# Patient Record
Sex: Male | Born: 1985 | Race: White | Hispanic: No | Marital: Married | Smoking: Never smoker
Health system: Southern US, Community
[De-identification: ages and names within clinical notes are randomized; demographics above are authoritative.]

## PROBLEM LIST (undated history)

## (undated) DIAGNOSIS — F32A Depression, unspecified: Secondary | ICD-10-CM

## (undated) DIAGNOSIS — F329 Major depressive disorder, single episode, unspecified: Secondary | ICD-10-CM

## (undated) DIAGNOSIS — F419 Anxiety disorder, unspecified: Secondary | ICD-10-CM

---

## 2015-12-28 DIAGNOSIS — F322 Major depressive disorder, single episode, severe without psychotic features: Secondary | ICD-10-CM | POA: Diagnosis present

## 2018-06-18 DIAGNOSIS — F411 Generalized anxiety disorder: Secondary | ICD-10-CM | POA: Diagnosis present

## 2018-07-15 ENCOUNTER — Encounter: Payer: Self-pay | Admitting: Emergency Medicine

## 2018-07-15 ENCOUNTER — Emergency Department: Payer: No Typology Code available for payment source

## 2018-07-15 ENCOUNTER — Other Ambulatory Visit: Payer: Self-pay

## 2018-07-15 ENCOUNTER — Emergency Department
Admission: EM | Admit: 2018-07-15 | Discharge: 2018-07-15 | Disposition: A | Discharge status: Home / Self Care | Payer: No Typology Code available for payment source | Attending: Emergency Medicine | Admitting: Emergency Medicine

## 2018-07-15 DIAGNOSIS — N132 Hydronephrosis with renal and ureteral calculous obstruction: Secondary | ICD-10-CM | POA: Diagnosis not present

## 2018-07-15 DIAGNOSIS — N2889 Other specified disorders of kidney and ureter: Secondary | ICD-10-CM | POA: Diagnosis not present

## 2018-07-15 DIAGNOSIS — R109 Unspecified abdominal pain: Secondary | ICD-10-CM | POA: Diagnosis present

## 2018-07-15 DIAGNOSIS — F419 Anxiety disorder, unspecified: Secondary | ICD-10-CM | POA: Insufficient documentation

## 2018-07-15 DIAGNOSIS — F329 Major depressive disorder, single episode, unspecified: Secondary | ICD-10-CM | POA: Diagnosis not present

## 2018-07-15 HISTORY — DX: Depression, unspecified: F32.A

## 2018-07-15 HISTORY — DX: Major depressive disorder, single episode, unspecified: F32.9

## 2018-07-15 HISTORY — DX: Anxiety disorder, unspecified: F41.9

## 2018-07-15 LAB — URINALYSIS, ROUTINE W REFLEX MICROSCOPIC
BILIRUBIN URINE: NEGATIVE
Glucose, UA: NEGATIVE mg/dL
Ketones, ur: 5 mg/dL — AB
LEUKOCYTE UA: NEGATIVE
Nitrite: NEGATIVE
Protein, ur: 30 mg/dL — AB
RBC / HPF: >50 RBC/hpf — ABNORMAL HIGH (ref 0–5)
Specific Gravity, Urine: 1.019 (ref 1.005–1.030)
Squamous Epithelial / HPF: NONE SEEN (ref 0–5)
pH: 7 (ref 5.0–8.0)

## 2018-07-15 LAB — CBC WITH DIFFERENTIAL/PLATELET
Abs Immature Granulocytes: 0.03 10*3/uL (ref 0.00–0.07)
Basophils Absolute: 0.1 10*3/uL (ref 0.0–0.1)
Basophils Relative: 1 %
Eosinophils Absolute: 0.3 10*3/uL (ref 0.0–0.5)
Eosinophils Relative: 3 %
HCT: 44.8 % (ref 39.0–52.0)
Hemoglobin: 15.6 g/dL (ref 13.0–17.0)
Immature Granulocytes: 0 %
Lymphocytes Relative: 16 %
Lymphs Abs: 1.4 10*3/uL (ref 0.7–4.0)
MCH: 31.6 pg (ref 26.0–34.0)
MCHC: 34.8 g/dL (ref 30.0–36.0)
MCV: 90.9 fL (ref 80.0–100.0)
Monocytes Absolute: 0.6 10*3/uL (ref 0.1–1.0)
Monocytes Relative: 7 %
Neutro Abs: 6.3 10*3/uL (ref 1.7–7.7)
Neutrophils Relative %: 73 %
Platelets: 212 10*3/uL (ref 150–400)
RBC: 4.93 MIL/uL (ref 4.22–5.81)
RDW: 11.9 % (ref 11.5–15.5)
WBC: 8.6 10*3/uL (ref 4.0–10.5)
nRBC: 0 % (ref 0.0–0.2)

## 2018-07-15 LAB — COMPREHENSIVE METABOLIC PANEL
ALT: 32 U/L (ref 0–44)
AST: 36 U/L (ref 15–41)
Albumin: 4.1 g/dL (ref 3.5–5.0)
Alkaline Phosphatase: 50 U/L (ref 38–126)
Anion gap: 9 (ref 5–15)
BUN: 10 mg/dL (ref 6–20)
CHLORIDE: 107 mmol/L (ref 98–111)
CO2: 24 mmol/L (ref 22–32)
Calcium: 9.2 mg/dL (ref 8.9–10.3)
Creatinine, Ser: 0.99 mg/dL (ref 0.61–1.24)
GFR calc Af Amer: >60 mL/min (ref >60)
Glucose, Bld: 125 mg/dL — ABNORMAL HIGH (ref 70–99)
Potassium: 4 mmol/L (ref 3.5–5.1)
Sodium: 140 mmol/L (ref 135–145)
Total Bilirubin: 0.7 mg/dL (ref 0.3–1.2)
Total Protein: 6.7 g/dL (ref 6.5–8.1)

## 2018-07-15 LAB — LIPASE, BLOOD: Lipase: 30 U/L (ref 11–51)

## 2018-07-15 MED ORDER — OXYCODONE-ACETAMINOPHEN 5-325 MG PO TABS: 2.0000 | ORAL_TABLET | Freq: Four times a day (QID) | ORAL | 0 refills | Status: DC | PRN

## 2018-07-15 MED ORDER — TAMSULOSIN HCL 0.4 MG PO CAPS: ORAL_CAPSULE | ORAL | 0 refills | Status: AC

## 2018-07-15 MED ORDER — ONDANSETRON HCL 4 MG/2ML IJ SOLN
4.0000 mg | INTRAMUSCULAR | Status: AC
  Administered 2018-07-15: 4 mg via INTRAVENOUS
  Filled 2018-07-15: qty 2

## 2018-07-15 MED ORDER — ONDANSETRON 4 MG PO TBDP: ORAL_TABLET | ORAL | 0 refills | Status: AC

## 2018-07-15 MED ORDER — DOCUSATE SODIUM 100 MG PO CAPS: ORAL_CAPSULE | ORAL | 0 refills | Status: AC

## 2018-07-15 MED ORDER — KETOROLAC TROMETHAMINE 30 MG/ML IJ SOLN
15.0000 mg | Freq: Once | INTRAMUSCULAR | Status: AC
  Administered 2018-07-15: 15 mg via INTRAVENOUS
  Filled 2018-07-15: qty 1

## 2018-07-15 MED ORDER — HYDROMORPHONE HCL 1 MG/ML IJ SOLN
1.0000 mg | INTRAMUSCULAR | Status: AC
  Administered 2018-07-15: 1 mg via INTRAVENOUS
  Filled 2018-07-15: qty 1

## 2018-07-15 NOTE — ED Triage Notes (Signed)
Pt to triage via w/c, moaning & grimacing; c/o right sided back & abd pain x wk; denies hx of same

## 2018-07-15 NOTE — ED Notes (Signed)
Pt is AOx4, vss, he does not show any signs of distress and c/o pain at 5/10 in his groin. MD aware. Pt is in bed with rails upx2, on the monitor and call bell is within reach. We will continue to monitor the pt.

## 2018-07-15 NOTE — ED Provider Notes (Signed)
[facility] Emergency Department Provider Note  ____________________________________________   First MD Initiated Contact with Patient 07/15/18 [phone]     (approximate)  I have reviewed the triage vital signs and the nursing notes.   HISTORY  Chief Complaint Flank Pain   Level 5 caveat:  history/ROS limited by acute/critical illness   HPI Gilbert Cabrera is a 33 y.o. male with psychiatric history as listed below and who reports no chronic medical issues and no prior surgeries.  He presents per private vehicle for evaluation of severe right flank pain that is radiating into the right side of his abdomen and down into his groin.  He reports that he has had the pain intermittently for about a week but it has been relatively mild.  Is gotten much worse over the last day and then tonight it became severe and intolerable.  He is crying and yelling and moaning in pain and has difficulty focusing on my questions.   He denies fever/chills, chest pain, shortness of breath.  He has had some nausea, no vomiting.  No diarrhea nor constipation.  No dysuria and no hematuria but he says he feels like he needs to urinate but he is not able to do so.  The testicles are not specifically tender and he has no swelling in his scrotum, but the pain seems to radiate from his right flank down into the groin.  He has no history of kidney stones, no prior surgical history.  He says that his depression has been worse than usual lately but he has been taking his depression medications.        Past Medical History:  Diagnosis Date   Anxiety    Depression     There are no active problems to display for this patient.   History reviewed. No pertinent surgical history.  Prior to Admission medications   Medication Sig Start Date End Date Taking? Authorizing Provider  docusate sodium (COLACE) 100 MG capsule Take 1 tablet once or twice daily as needed for constipation while taking  narcotic pain medicine 07/15/18   Loleta RoseForbach, Deklen Popelka, MD  ondansetron (ZOFRAN ODT) 4 MG disintegrating tablet Allow 1-2 tablets to dissolve in your mouth every 8 hours as needed for nausea/vomiting 07/15/18   Loleta RoseForbach, Britnie Colville, MD  oxyCODONE-acetaminophen (PERCOCET) 5-325 MG tablet Take 2 tablets by mouth every 6 (six) hours as needed for severe pain. 07/15/18   Loleta RoseForbach, Veeda Virgo, MD  tamsulosin (FLOMAX) 0.4 MG CAPS capsule Take 1 tablet by mouth daily until you pass the kidney stone or no longer have symptoms 07/15/18   Loleta RoseForbach, Max Nuno, MD    Allergies Patient has no known allergies.  No family history on file.  Social History Social History   Tobacco Use   Smoking status: Not on file  Substance Use Topics   Alcohol use: Not on file   Drug use: Not on file    Review of Systems Constitutional: No fever/chills Eyes: No visual changes. ENT: No sore throat. Cardiovascular: Denies chest pain. Respiratory: Denies shortness of breath. Gastrointestinal: Right flank pain that radiates into the right lower quadrant of the abdomen and right groin.  Nausea, no vomiting, no diarrhea nor constipation. Genitourinary: Pain radiating from the right flank into the groin. Negative for dysuria and hematuria. Musculoskeletal: Right flank pain that radiates into the right lower quadrant of the abdomen and groin as described above. Integumentary: Negative for rash. Neurological: Negative for headaches, focal weakness or numbness.   ____________________________________________   PHYSICAL  EXAM:  VITAL SIGNS: ED Triage Vitals  Enc Vitals Group     BP 07/15/18 0627 (!) 146/130     Pulse Rate 07/15/18 0627 83     Resp 07/15/18 0627 18     Temp 07/15/18 0627 97.9 F (36.6 C)     Temp Source 07/15/18 0627 Oral     SpO2 07/15/18 0627 100 %     Weight 07/15/18 0620 77.1 kg (170 lb)     Height 07/15/18 0620 1.753 m ( )     Head Circumference --      Peak Flow --      Pain Score 07/15/18 0621 10     Pain  Loc --      Pain Edu? --      Excl. in GC? --     Constitutional: Alert and oriented.  The patient is generally healthy in appearance but is in severe distress at the moment. Eyes: Conjunctivae are normal.  Head: Atraumatic. Nose: No congestion/rhinnorhea. Mouth/Throat: Mucous membranes are moist. Neck: No stridor.  No meningeal signs.   Cardiovascular: Normal rate, regular rhythm. Good peripheral circulation. Grossly normal heart sounds. Respiratory: Normal respiratory effort.  No retractions. Lungs CTAB. Gastrointestinal: Soft and nondistended.  Generalized diffuse tenderness to palpation of the abdomen but exam is limited by the patient's acute pain/discomfort.  Patient is guarding and cannot relax for a good abdominal exam. Genitourinary: Normal external male genitalia.  No tenderness to palpation of the testes, no scrotal edema, no penile discharge. Musculoskeletal: Severe tenderness to percussion of the right flank.  No lower extremity tenderness nor edema. No gross deformities of extremities. Neurologic:  Normal speech and language. No gross focal neurologic deficits are appreciated.  Skin:  Skin is warm, dry and intact. No rash noted. Psychiatric: Mood and affect are very anxious and in severe distress.  Patient denies SI/HI but admits to worse recent depression.  ____________________________________________   LABS (all labs ordered are listed, but only abnormal results are displayed)  Labs Reviewed  COMPREHENSIVE METABOLIC PANEL - Abnormal; Notable for the following components:      Result Value   Glucose, Bld 125 (*)    All other components within normal limits  CBC WITH DIFFERENTIAL/PLATELET  LIPASE, BLOOD  URINALYSIS, ROUTINE W REFLEX MICROSCOPIC   ____________________________________________  EKG  None - EKG not ordered by ED physician ____________________________________________  RADIOLOGY   ED MD interpretation:  small right UVJ stone with hydronephrosis.   left kidney mass  Official radiology report(s): Ct Renal Stone Study  Result Date: 07/15/2018 CLINICAL DATA:  Right-sided abdominal/flank pain EXAM: CT ABDOMEN AND PELVIS WITHOUT CONTRAST TECHNIQUE: Multidetector CT imaging of the abdomen and pelvis was performed following the standard protocol without oral or IV contrast. COMPARISON:  None. FINDINGS: Lower chest: Lung bases are clear. Hepatobiliary: No focal liver lesions are appreciable on this noncontrast enhanced study. The gallbladder wall is not appreciably thickened. There is no biliary duct dilatation. Pancreas: No pancreatic mass or inflammatory focus. Spleen: No splenic lesions are evident. Adrenals/Urinary Tract: Adrenals bilaterally appear unremarkable. There is a focal area of increased attenuation in the lateral left mid kidney measuring 1.0 x 1.0 cm. Right kidney appears mildly edematous. There is moderate hydronephrosis on the right. There is no hydronephrosis on the left. There is no appreciable intrarenal calculus on either side. There is a 1 mm calculus at the right ureterovesical junction. No other ureteral calculi are evident. There is a phlebolith on the left near but separate from  the distal left ureter. Urinary bladder is midline with wall thickness within normal limits. Stomach/Bowel: There is no appreciable bowel wall or mesenteric thickening. There is no evident bowel obstruction. There is no free air or portal venous air. Vascular/Lymphatic: No abdominal aortic aneurysm. No vascular lesions are evident on this noncontrast enhanced study. There is no appreciable adenopathy in the abdomen or pelvis. Reproductive: Prostate and seminal vesicles are normal in size and contour. There is no evident pelvic mass. Other: The appendix appears normal. There is no abscess or ascites in the abdomen or pelvis. Musculoskeletal: No blastic or lytic bone lesions. No intramuscular or abdominal wall lesion. IMPRESSION: 1. 1 mm calculus at the right  ureterovesical junction causing moderate hydronephrosis on the right. Right kidney is subtly edematous. 2. 1 x 1 cm mass in the lateral mid left kidney with attenuation values higher than is expected with a cyst. This structure may represent a hyperdense cyst. It does warrant further evaluation, however. Further evaluation with pre and post contrast MRI nonemergently should be considered. Pre and post contrast CT could alternatively be performed, but would likely be of decreased accuracy given lesion size. 3. No bowel obstruction. No abscess in the abdomen or pelvis. Appendix appears normal. Electronically Signed   By: Bretta Bang III M.D.   On: 07/15/2018 07:12    ____________________________________________   PROCEDURES   Procedure(s) performed (including Critical Care):  Procedures   ____________________________________________   INITIAL IMPRESSION / MDM / ASSESSMENT AND PLAN / ED COURSE  As part of my medical decision making, I reviewed the following data within the electronic MEDICAL RECORD NUMBER Nursing notes reviewed and incorporated, Labs reviewed , Old chart reviewed, Patient signed out to Dr. Scotty Court, and Notes from prior ED visits and reviewed [place] Controlled Substance Database.         Differential diagnosis includes, but is not limited to, renal/ureteral colic, UTI/pyelonephritis, appendicitis, biliary colic.  Mesenteric ischemia is much less likely.  The patient reports no travel history and no contact with individuals known to have COVID-19.  He has no respiratory symptoms at all, is afebrile, and has stable vital signs.  The way he is acting with his severe distress, severe pain, inability to remain still, etc., are all strongly consistent with renal colic.  We are placing an IV immediately and giving him hydromorphone 1 mg IV and ondansetron 4 mg IV.  No indication for fluids at this time.  I am checking basic labs and will obtain a CT renal stone protocol of the abdomen and  pelvis for further evaluation of his pain.  He states that he understands and agrees with the plan.  Clinical Course as of Jul 15 735  Wed Jul 15, 2018  0649 WBC: 8.6 [CF]  2878 Transferring ED care to Dr. Scotty Court at 7:00 AM to follow-up on the imaging results, urinalysis, and reassessment of pain.   [CF]  0720 Patient has a 1 mm right UVJ stone with some hydronephrosis.  Pain is better at this time.  Urinalysis is pending.   [CF]  0725 Giving Toradol 15 mg IV   [CF]  0730 Transferring ED care to Dr. Scotty Court to follow up UA and dispo appropriately.  Updated patient in person as to the results, including the need for urology follow-up regarding the renal mass, and included this information in his discharge instructions.  Medications have been sent to his pharmacy of choice.  Transferred care to Dr. Scotty Court.  UA pending.   [CF]  Clinical Course User Index [CF] Loleta Rose, MD    ____________________________________________  FINAL CLINICAL IMPRESSION(S) / ED DIAGNOSES  Final diagnoses:  Ureteral stone with hydronephrosis  Renal mass, left     MEDICATIONS GIVEN DURING THIS VISIT:  Medications  HYDROmorphone (DILAUDID) injection 1 mg (1 mg Intravenous Given 07/15/18 0634)  ondansetron (ZOFRAN) injection 4 mg (4 mg Intravenous Given 07/15/18 0634)  ketorolac (TORADOL) 30 MG/ML injection 15 mg (15 mg Intravenous Given 07/15/18 0734)     ED Discharge Orders         Ordered    oxyCODONE-acetaminophen (PERCOCET) 5-325 MG tablet  Every 6 hours PRN     07/15/18 0736    ondansetron (ZOFRAN ODT) 4 MG disintegrating tablet     07/15/18 0736    tamsulosin (FLOMAX) 0.4 MG CAPS capsule     07/15/18 0736    docusate sodium (COLACE) 100 MG capsule     07/15/18 0736           Note:  This document was prepared using Dragon voice recognition software and may include unintentional dictation errors.   Loleta Rose, MD 07/15/18 [phone]

## 2018-07-15 NOTE — ED Notes (Signed)
Pt is being discharged to home. Aox4, VSS, pt does not c/o any pain at this time. AVS & RX was given and explained to the pt and he verbalized understanding of all information. Pt was also given urine strainer to take home.

## 2018-07-15 NOTE — ED Notes (Signed)
ED Provider at bedside. 

## 2018-07-15 NOTE — Discharge Instructions (Addendum)
You have been seen in the Emergency Department (ED) today for pain that we believe based on your workup, is caused by kidney stones.  As we have discussed, please drink plenty of fluids.  Please make a follow up appointment with the physician(s) listed elsewhere in this documentation. Your urine test does not show any signs of infection, so antibiotics will not be helpful right now.  You may take pain medication as needed but ONLY as prescribed.  Please also take your prescribed Flomax daily.  We also recommend that you take over-the-counter ibuprofen regularly according to label instructions over the next 5 days.  Take it with meals to minimize stomach discomfort.  Please see your doctor as soon as possible as stones may take 1-3 weeks to pass and you may require additional care or medications.  Do not drink alcohol, drive or participate in any other potentially dangerous activities while taking opiate pain medication as it may make you sleepy. Do not take this medication with any other sedating medications, either prescription or over-the-counter. If you were prescribed Percocet or Vicodin, do not take these with acetaminophen (Tylenol) as it is already contained within these medications.   Take Percocet as needed for severe pain.  This medication is an opiate (or narcotic) pain medication and can be habit forming.  Use it as little as possible to achieve adequate pain control.  Do not use or use it with extreme caution if you have a history of opiate abuse or dependence.  If you are on a pain contract with your primary care doctor or a pain specialist, be sure to let them know you were prescribed this medication today from the [facility] Emergency Department.  This medication is intended for your use only - do not give any to anyone else and keep it in a secure place where nobody else, especially children, have access to it.  It will also cause or worsen constipation, so you may want to consider  taking an over-the-counter stool softener while you are taking this medication.  Please note that you also have a small mass in your left kidney.  Although this is likely a simple cyst, we recommend you follow up with Urology (Dr. Apolinar Junes or one of her colleagues) to discuss if any additional testing or imaging is recommended at this time.  Return to the Emergency Department (ED) or call your doctor if you have any worsening pain, fever, painful urination, are unable to urinate, or develop other symptoms that concern you.

## 2018-07-20 ENCOUNTER — Other Ambulatory Visit: Payer: Self-pay

## 2018-07-20 ENCOUNTER — Emergency Department
Admission: EM | Admit: 2018-07-20 | Discharge: 2018-07-20 | Disposition: A | Discharge status: Home / Self Care | Payer: PRIVATE HEALTH INSURANCE | Attending: Emergency Medicine | Admitting: Emergency Medicine

## 2018-07-20 ENCOUNTER — Encounter: Payer: Self-pay | Admitting: Emergency Medicine

## 2018-07-20 DIAGNOSIS — F322 Major depressive disorder, single episode, severe without psychotic features: Secondary | ICD-10-CM | POA: Diagnosis not present

## 2018-07-20 DIAGNOSIS — F329 Major depressive disorder, single episode, unspecified: Secondary | ICD-10-CM | POA: Insufficient documentation

## 2018-07-20 DIAGNOSIS — Z79899 Other long term (current) drug therapy: Secondary | ICD-10-CM | POA: Diagnosis not present

## 2018-07-20 DIAGNOSIS — F419 Anxiety disorder, unspecified: Secondary | ICD-10-CM | POA: Diagnosis not present

## 2018-07-20 DIAGNOSIS — F411 Generalized anxiety disorder: Secondary | ICD-10-CM | POA: Diagnosis present

## 2018-07-20 DIAGNOSIS — Z008 Encounter for other general examination: Secondary | ICD-10-CM | POA: Diagnosis present

## 2018-07-20 LAB — BASIC METABOLIC PANEL
Anion gap: 9 (ref 5–15)
BUN: 13 mg/dL (ref 6–20)
CO2: 28 mmol/L (ref 22–32)
Calcium: 9.4 mg/dL (ref 8.9–10.3)
Chloride: 104 mmol/L (ref 98–111)
Creatinine, Ser: 0.72 mg/dL (ref 0.61–1.24)
GFR calc Af Amer: >60 mL/min (ref >60)
GFR calc non Af Amer: >60 mL/min (ref >60)
GLUCOSE: 144 mg/dL — AB (ref 70–99)
Potassium: 4 mmol/L (ref 3.5–5.1)
Sodium: 141 mmol/L (ref 135–145)

## 2018-07-20 LAB — CBC WITH DIFFERENTIAL/PLATELET
Abs Immature Granulocytes: 0.01 10*3/uL (ref 0.00–0.07)
Basophils Absolute: 0.1 10*3/uL (ref 0.0–0.1)
Basophils Relative: 1 %
Eosinophils Absolute: 0.4 10*3/uL (ref 0.0–0.5)
Eosinophils Relative: 7 %
HCT: 45 % (ref 39.0–52.0)
Hemoglobin: 15.3 g/dL (ref 13.0–17.0)
Immature Granulocytes: 0 %
LYMPHS PCT: 26 %
Lymphs Abs: 1.4 10*3/uL (ref 0.7–4.0)
MCH: 31.5 pg (ref 26.0–34.0)
MCHC: 34 g/dL (ref 30.0–36.0)
MCV: 92.6 fL (ref 80.0–100.0)
Monocytes Absolute: 0.3 10*3/uL (ref 0.1–1.0)
Monocytes Relative: 5 %
Neutro Abs: 3.2 10*3/uL (ref 1.7–7.7)
Neutrophils Relative %: 61 %
Platelets: 234 10*3/uL (ref 150–400)
RBC: 4.86 MIL/uL (ref 4.22–5.81)
RDW: 11.9 % (ref 11.5–15.5)
WBC: 5.3 10*3/uL (ref 4.0–10.5)
nRBC: 0 % (ref 0.0–0.2)

## 2018-07-20 LAB — URINE DRUG SCREEN, QUALITATIVE (ARMC ONLY)
Amphetamines, Ur Screen: NOT DETECTED
Barbiturates, Ur Screen: NOT DETECTED
Benzodiazepine, Ur Scrn: NOT DETECTED
Cannabinoid 50 Ng, Ur ~~LOC~~: NOT DETECTED
Cocaine Metabolite,Ur ~~LOC~~: NOT DETECTED
MDMA (Ecstasy)Ur Screen: NOT DETECTED
Methadone Scn, Ur: NOT DETECTED
Opiate, Ur Screen: NOT DETECTED
Phencyclidine (PCP) Ur S: NOT DETECTED
Tricyclic, Ur Screen: NOT DETECTED

## 2018-07-20 LAB — ETHANOL: Alcohol, Ethyl (B): <10 mg/dL (ref <10)

## 2018-07-20 LAB — TSH: TSH: 0.874 u[IU]/mL (ref 0.350–4.500)

## 2018-07-20 MED ORDER — VENLAFAXINE HCL ER 150 MG PO CP24: 150.0000 mg | ORAL_CAPSULE | Freq: Every day | ORAL | Status: DC

## 2018-07-20 MED ORDER — VENLAFAXINE HCL ER 150 MG PO CP24: 150.0000 mg | ORAL_CAPSULE | Freq: Every day | ORAL | 0 refills | Status: AC

## 2018-07-20 NOTE — Progress Notes (Signed)
Patient arrived to room in [facility].

## 2018-07-20 NOTE — Consult Note (Signed)
[facility] Face-to-Face Psychiatry Consult   Reason for Consult:  Worsening depression Referring Physician:  Dr. Mayford KnifeWilliams Patient Identification: Gilbert Cabrera MRN:  [id number] Principal Diagnosis: Depression, major, single episode, severe (HCC) Diagnosis:  Principal Problem:   Depression, major, single episode, severe (HCC) Active Problems:   Generalized anxiety disorder  Patient seen, chart is reviewed. Total Time spent with patient: 1 hour  Subjective: "I do not know what to do with my medication, it is not working.  I am still depressed and would like to feel better."  HPI:  Gilbert Numbershomas Joseph Baylock is a 33 y.o. male patient with a history of anxiety and depression who presents to the ED for dark thoughts for the past month and thoughts of suicidal ideation but no specific plan.  Patient states she does not want to die, he needs to get something straightened out in his life.  Wife took the kids moved back to [place] about a month ago because he was being too rough with a 7069-month-old daughter.  Patient states she was screaming and he was moving around too quickly.  Patient states he is at home and does not feel his Effexor is helping him at this time.  On evaluation, patient reports having history of depression and was treated 2 years ago with Lexapro by Dr. Thedore MinsSingh.  He reports that at that time, he did not continue on treatment due to "feeling like a zombie and having out of body experiences.  When he discussed the side effects with his provider at the time it was encouraged that he increase the dose.  Patient was not comfortable with that and chose to discontinue medications.  Patient describes that 5 weeks ago he and his wife have been experiencing financial stressors which has caused him to have increased anxiety.  He has a 2469-month-old and a 31-year-old child and at one point while he was feeling stressed his wife felt like he was handling the baby to roughly.  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  Patient does have a follow-up appointment in 3 days, however he began to have some suicidal thoughts in the context of his depression not improving and wanted further evaluation and recommendations.  Patient describes decreased appetite, and difficulty falling asleep.  He otherwise reports good energy, good concentration, enjoyment in usual activities, and motivation to work and reunify his family.  Patient has also been seeing a therapist, Meredith LeedsBeth Kincaid in [place] which she has found to be helpful.  Patient relates that he is wanting to improve.  He is hopeful that his wife and children will be able to return to [place] so that they can live together as a family.  He states that he believes he can prove himself to be able to be a good father and husband.  Patient's and patient's wife's family are both in [place], he feels supported by both families.  Patient is very future oriented and is currently denying any suicidal ideation, plan, or intent.  Patient denies any homicidal ideation.  Patient has no history of mania or psychosis.  Past Psychiatric History: Depression, anxiety  Risk to Self:  Denying suicidal plan or intent Risk to  Others:  Denies Prior Inpatient Therapy:  None Prior Outpatient Therapy:  Currently in treatment with psychotherapy with Meredith Leeds; primary care provider is prescribing venlafaxine; previous outpatient psychiatrist Dr. Thedore Mins; patient is interested in establishing with a new outpatient psychiatrist.  Past Medical History:  Past Medical History:  Diagnosis Date  . Anxiety   . Depression    History reviewed. No pertinent surgical history. Family History: No family  history on file. Family Psychiatric  History: Denies No suicides in the family . Social History:  Social History   Substance and Sexual Activity  Alcohol Use Not on file     Social History   Substance and Sexual Activity  Drug Use Not on file    Social History   Socioeconomic History  . Marital status: Married    Spouse name: Not on file  . Number of children: Not on file  . Years of education: Not on file  . Highest education level: Not on file  Occupational History  . Not on file  Social Needs  . Financial resource strain: Not on file  . Food insecurity:    Worry: Not on file    Inability: Not on file  . Transportation needs:    Medical: Not on file    Non-medical: Not on file  Tobacco Use  . Smoking status: Not on file  Substance and Sexual Activity  . Alcohol use: Not on file  . Drug use: Not on file  . Sexual activity: Not on file  Lifestyle  . Physical activity:    Days per week: Not on file    Minutes per session: Not on file  . Stress: Not on file  Relationships  . Social connections:    Talks on phone: Not on file    Gets together: Not on file    Attends religious service: Not on file    Active member of club or organization: Not on file    Attends meetings of clubs or organizations: Not on file    Relationship status: Not on file  Other Topics Concern  . Not on file  Social History Narrative  . Not on file   Additional Social History:  Patient is married, however separated and locality from his wife and 2 children while he "works on my issues and continues working in [employer]."  Works as a Tax inspector for a [employer]  He is still employed as an Programmer, applications during corona virus pandemic.  Patient endorses significant caffeine and nicotine use.  Patient denies alcohol use Patient reports using marijuana approximately 2 weeks ago on 1 occasion.  He denies other illicit substances. He reports marijuana caused  him to feel more paranoid and he has no intent on using again.  Allergies:  No Known Allergies  Labs: No results found for this or any previous visit (from the past 48 hour(s)).  No current facility-administered medications for this encounter.    Current Outpatient Medications  Medication Sig Dispense Refill  . docusate sodium (COLACE) 100 MG capsule Take 1 tablet once or twice daily as needed for constipation while taking narcotic pain medicine 30 capsule 0  . ondansetron (ZOFRAN ODT) 4 MG disintegrating tablet Allow 1-2 tablets to dissolve in your mouth every 8 hours as needed for nausea/vomiting 30 tablet 0  . oxyCODONE-acetaminophen (PERCOCET) 5-325 MG tablet Take 2 tablets by mouth every 6 (six) hours as needed for severe pain. 16 tablet 0  . tamsulosin (FLOMAX) 0.4 MG CAPS  capsule Take 1 tablet by mouth daily until you pass the kidney stone or no longer have symptoms 14 capsule 0  . venlafaxine XR (EFFEXOR-XR) 37.5 MG 24 hr capsule Take 1 capsule by mouth daily.      Musculoskeletal: Strength & Muscle Tone: within normal limits Gait & Station: normal Patient leans: N/A  Psychiatric Specialty Exam: Physical Exam  Nursing note and vitals reviewed. Constitutional: He is oriented to person, place, and time. He appears well-developed and well-nourished. No distress.  HENT:  Head: Normocephalic and atraumatic.  Eyes: EOM are normal.  Neck: Normal range of motion.  Cardiovascular: Normal rate and regular rhythm.  Respiratory: Effort normal. No respiratory distress.  Musculoskeletal: Normal range of motion.  Neurological: He is alert and oriented to person, place, and time.    Review of Systems  Psychiatric/Behavioral: Positive for depression. Negative for hallucinations, memory loss, substance abuse and suicidal ideas. The patient is not nervous/anxious and does not have insomnia.   All other systems reviewed and are negative.   Blood pressure 129/74, pulse 71, temperature 98.5  F (36.9 C), temperature source Oral, resp. rate 16, height 5\' 9"  (1.753 m), weight 72.6 kg, SpO2 97 %.Body mass index is 23.63 kg/m.  General Appearance: Well Groomed  Eye Contact:  Good  Speech:  Clear and Coherent and Normal Rate  Volume:  Normal  Mood:  Anxious and Dysphoric  Affect:  Congruent  Thought Process:  Goal Directed and Descriptions of Associations: Intact  Orientation:  Full (Time, Place, and Person)  Thought Content:  Logical and Hallucinations: None  Suicidal Thoughts:  No  Homicidal Thoughts:  No  Memory:  Good  Judgement:  Good  Insight:  Good  Psychomotor Activity:  Normal  Concentration:  Concentration: Good  Recall:  Good  Fund of Knowledge:  Good  Language:  Good  Akathisia:  No  Handed:  Right  AIMS (if indicated):     Assets:  Communication Skills Desire for Improvement Financial Resources/Insurance Housing Social Support Talents/Skills Transportation Vocational/Educational  ADL's:  Intact  Cognition:  WNL  Sleep:   decreased     Treatment Plan Summary: Medication management  Increase Effexor XR to 75 mg daily for 1-2 weeks Follow-up with PCP on 07/23/2018 for recheck Provided prescription for Effexor XR 150 mg qd if indicated-prescription sent to pharmacy. I have encouraged patient to avoid nicotine and caffeine after 1- 2 pm, as this may be impacting his quality of sleep at night, as well as increasing anxiety. Encourage smoking cessation as this may also increase his appetite.  Disposition: No evidence of imminent risk to self or others at present.   Patient does not meet criteria for psychiatric inpatient admission. Supportive therapy provided about ongoing stressors. Discussed crisis plan, support from social network, calling 911, coming to the Emergency Department, and calling Suicide Hotline. provided phone numbers for suicide text crisis line.   Provided phone number for [facility] to establish with psychiatrist Keep  regular appointments with therapist.   Mariel Craft, MD 07/20/2018 12:55 PM

## 2018-07-20 NOTE — ED Triage Notes (Signed)
Pt reports "dark thoughts" for a month and thoughts of SI with no plan.

## 2018-07-20 NOTE — ED Notes (Signed)
Pt. Providing UA sample. Will send when sample provided.

## 2018-07-20 NOTE — ED Provider Notes (Signed)
[facility] Emergency Department Provider Note       Time seen: ----------------------------------------- 12:56 PM on 07/20/2018 -----------------------------------------   I have reviewed the triage vital signs and the nursing notes.  HISTORY   Chief Complaint Mental Health Problem and Suicidal   HPI Gilbert Cabrera is a 33 y.o. male with a history of anxiety and depression who presents to the ED for dark thoughts for the past month and thoughts of suicidal ideation but no specific plan.  Patient states she does not want to die, he needs to get something straightened out in his life.  Wife took the kids moved back to [place] about a month ago because he was being too rough with a 16-month-old daughter.  Patient states she was screaming and he was moving around too quickly.  Patient states he is at home and does not feel his Effexor is helping him at this time.  Past Medical History:  Diagnosis Date  . Anxiety   . Depression     Patient Active Problem List   Diagnosis Date Noted  . Generalized anxiety disorder 06/18/2018  . 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 No pertinent surgical history.  Allergies Patient has no known allergies.  Social History Social History   Tobacco Use  . Smoking status: Not on file  Substance Use Topics  . Alcohol use: Not on file  . Drug use: Not on file   Review of Systems Constitutional: Negative for fever. Cardiovascular: Negative for chest pain. Respiratory: Negative for shortness of breath. Gastrointestinal: Negative for abdominal pain, vomiting and diarrhea. Musculoskeletal: Negative for back pain. Skin: Negative for rash. Neurological: Negative for headaches, focal weakness or numbness. Psychiatric: Positive for depressive thoughts, suicidal ideation but no active plan All systems negative/normal/unremarkable except as stated in the  HPI  ____________________________________________   PHYSICAL EXAM:  VITAL SIGNS: ED Triage Vitals  Enc Vitals Group     BP 07/20/18 1125 129/74     Pulse Rate 07/20/18 1137 71     Resp 07/20/18 1125 16     Temp 07/20/18 1125 98.5 F (36.9 C)     Temp Source 07/20/18 1125 Oral     SpO2 07/20/18 1137 97 %     Weight 07/20/18 1122 160 lb (72.6 kg)     Height 07/20/18 1122 5\' 9"  (1.753 m)     Head Circumference --      Peak Flow --      Pain Score 07/20/18 1122 0     Pain Loc --      Pain Edu? --      Excl. in GC? --    Constitutional: Alert and oriented. Well appearing and in no distress. Eyes: Conjunctivae are normal. Normal extraocular movements. ENT      Head: Normocephalic and atraumatic.      Nose: No congestion/rhinnorhea.      Mouth/Throat: Mucous membranes are moist.      Neck: No stridor. Cardiovascular: Normal rate, regular rhythm. No murmurs, rubs, or gallops. Respiratory: Normal respiratory effort without tachypnea nor retractions. Breath sounds are clear and equal bilaterally. No wheezes/rales/rhonchi. Gastrointestinal: Soft and nontender. Normal bowel sounds Musculoskeletal: Nontender with normal range of motion in extremities. No lower extremity tenderness nor edema. Neurologic:  Normal speech and language. No gross focal neurologic deficits are appreciated.  Skin:  Skin is warm, dry and intact. No rash noted. Psychiatric: Depressed mood and affect ____________________________________________  ED COURSE:  As  part of my medical decision making, I reviewed the following data within the electronic MEDICAL RECORD NUMBER History obtained from family if available, nursing notes, old chart and ekg, as well as notes from prior ED visits. Patient presented for depression and suicidal ideation, we will assess with labs and imaging as indicated at this time.   Procedures ____________________________________________   LABS (pertinent positives/negatives)  Labs Reviewed   URINE DRUG SCREEN, QUALITATIVE ([facility] ONLY)  ETHANOL  CBC WITH DIFFERENTIAL/PLATELET  BASIC METABOLIC PANEL  TSH   ___________________________________________   DIFFERENTIAL DIAGNOSIS   Depression, suicidal thoughts, substance abuse  FINAL ASSESSMENT AND PLAN  Depression   Plan: The patient had presented for depressive thoughts. Patient's labs are still pending at this time.  Overall he appears cleared medically for psychiatric evaluation and disposition.   Ulice Dash, MD    Note: This note was generated in part or whole with voice recognition software. Voice recognition is usually quite accurate but there are transcription errors that can and very often do occur. I apologize for any typographical errors that were not detected and corrected.     Emily Filbert, MD 07/20/18 [phone]

## 2018-07-20 NOTE — ED Notes (Signed)
Pt was "dressed out" while in triage. Items removed and bagged include one wallet, one cell phone, one set of keys, one hat, one pair of jeans, one pair of underwear, one pair of shoes, one belt, one pair of sunglasses, one pack of cigarettes.

## 2018-07-20 NOTE — ED Notes (Signed)
Pt. Being seen by psychiatry.    

## 2018-07-20 NOTE — BH Assessment (Signed)
Assessment Note  Gilbert Cabrera is an 33 y.o. male who presents to the ER due to having concerns about his ongoing depression and recent thoughts of dying. Patient states, he has no plans or intentions of harming himself or anyone else, but he's had thoughts of death. He further explains, approximately five to six weeks ago, his wife moved to [place] with their children. The plan was for him to "work on myself" and she was going to return. Patient admits he's been easily agitated and depressed. Currently, his PCP is prescribing his antidepressant and he hasn't' noticed much improvement.  During the interview, the patient was calm, cooperative and pleasant. He was able to provide appropriate answers to the questions. Throughout the interview, he denied SI/HI and AV/H. He also denies history of aggression and violence. He admits to the use of THC. The last date of use was 07/12/2018. He states hasn't smoke any more since, "I got dizzy and passed out."  Diagnosis: Depression  Past Medical History:  Past Medical History:  Diagnosis Date  . Anxiety   . Depression     History reviewed. No pertinent surgical history.  Family History: No family history on file.  Social History:  has no history on file for tobacco, alcohol, and drug.  Additional Social History:  Alcohol / Drug Use Pain Medications: See PTA Prescriptions: See PTA Over the Counter: See PTA History of alcohol / drug use?: Yes Longest period of sobriety (when/how long): Unable to quantify Negative Consequences of Use: (n/a) Withdrawal Symptoms: (n/a) Substance #1 Name of Substance 1: Cannabis 1 - Last Use / Amount: 07/12/2018  CIWA: CIWA-Ar BP: 129/74 Pulse Rate: 71 COWS:    Allergies: No Known Allergies  Home Medications: (Not in a hospital admission)   OB/GYN Status:  No LMP for male patient.  General Assessment Data Location of Assessment: [facility] ED TTS Assessment: In system Is this a Tele or Face-to-Face  Assessment?: Face-to-Face Is this an Initial Assessment or a Re-assessment for this encounter?: Initial Assessment Patient Accompanied by:: N/A Language Other than English: No Living Arrangements: Other (Comment)(Private Home) What gender do you identify as?: Male Marital status: Married Pregnancy Status: No Living Arrangements: Spouse/significant other Can pt return to current living arrangement?: Yes Admission Status: Voluntary Is patient capable of signing voluntary admission?: Yes Referral Source: Self/Family/Friend Insurance type: Astronomer Exam [facility] Walk-in ONLY) Medical Exam completed: Yes  Crisis Care Plan Living Arrangements: Spouse/significant other Legal Guardian: Other:(Self) Name of Psychiatrist: Reports of none Name of Therapist: Reports of none  Education Status Is patient currently in school?: No Is the patient employed, unemployed or receiving disability?: Employed  Risk to self with the past 6 months Suicidal Ideation: No Has patient been a risk to self within the past 6 months prior to admission? : No Suicidal Intent: No Has patient had any suicidal intent within the past 6 months prior to admission? : No Is patient at risk for suicide?: No Suicidal Plan?: No Has patient had any suicidal plan within the past 6 months prior to admission? : No Access to Means: No What has been your use of drugs/alcohol within the last 12 months?: Past cannabis use Previous Attempts/Gestures: No How many times?: 0 Other Self Harm Risks: Reports of none Triggers for Past Attempts: None known Intentional Self Injurious Behavior: None Family Suicide History: No Recent stressful life event(s): Other (Comment) Persecutory voices/beliefs?: No Depression: Yes Depression Symptoms: Tearfulness, Loss of interest in usual pleasures, Feeling worthless/self  pity Substance abuse history and/or treatment for substance abuse?: No Suicide prevention information given  to non-admitted patients: Not applicable  Risk to Others within the past 6 months Homicidal Ideation: No Does patient have any lifetime risk of violence toward others beyond the six months prior to admission? : No Thoughts of Harm to Others: No Current Homicidal Intent: No Current Homicidal Plan: No Access to Homicidal Means: No History of harm to others?: No Assessment of Violence: None Noted Violent Behavior Description: Reports of none Does patient have access to weapons?: No Does patient have a court date: No Is patient on probation?: No  Psychosis Hallucinations: None noted Delusions: None noted  Mental Status Report Appearance/Hygiene: Unremarkable, In scrubs Eye Contact: Fair Motor Activity: Freedom of movement, Unremarkable Speech: Logical/coherent, Unremarkable Level of Consciousness: Alert Mood: Depressed, Sad, Anxious, Pleasant Affect: Appropriate to circumstance, Depressed, Anxious, Sad Anxiety Level: Minimal Thought Processes: Coherent, Relevant Judgement: Unimpaired Orientation: Person, Place, Time, Situation, Appropriate for developmental age Obsessive Compulsive Thoughts/Behaviors: Minimal  Cognitive Functioning Concentration: Normal Memory: Recent Intact, Remote Intact Is patient IDD: No Insight: Fair Impulse Control: Fair Appetite: Fair Have you had any weight changes? : No Change Sleep: No Change Total Hours of Sleep: 7 Vegetative Symptoms: None  ADLScreening [facility] Assessment Services) Patient's cognitive ability adequate to safely complete daily activities?: Yes Patient able to express need for assistance with ADLs?: Yes Independently performs ADLs?: Yes (appropriate for developmental age)  Prior Inpatient Therapy Prior Inpatient Therapy: No  Prior Outpatient Therapy Prior Outpatient Therapy: No Does patient have an ACCT team?: No Does patient have Intensive In-House Services?  : No Does patient have [facility] services? : No Does patient  have P4CC services?: No  ADL Screening (condition at time of admission) Patient's cognitive ability adequate to safely complete daily activities?: Yes Is the patient deaf or have difficulty hearing?: No Does the patient have difficulty seeing, even when wearing glasses/contacts?: No Does the patient have difficulty concentrating, remembering, or making decisions?: No Patient able to express need for assistance with ADLs?: Yes Does the patient have difficulty dressing or bathing?: No Independently performs ADLs?: Yes (appropriate for developmental age) Does the patient have difficulty walking or climbing stairs?: No Weakness of Legs: None Weakness of Arms/Hands: None  Home Assistive Devices/Equipment Home Assistive Devices/Equipment: None  Therapy Consults (therapy consults require a physician order) PT Evaluation Needed: No OT Evalulation Needed: No SLP Evaluation Needed: No Abuse/Neglect Assessment (Assessment to be complete while patient is alone) Abuse/Neglect Assessment Can Be Completed: Yes Physical Abuse: Denies Verbal Abuse: Denies Sexual Abuse: Denies Exploitation of patient/patient's resources: Denies Self-Neglect: Denies Values / Beliefs Cultural Requests During Hospitalization: None Spiritual Requests During Hospitalization: None Consults Spiritual Care Consult Needed: No Social Work Consult Needed: No         Child/Adolescent Assessment Running Away Risk: Denies(Patient is an adult)  Disposition:  Disposition Initial Assessment Completed for this Encounter: Yes  On Site Evaluation by:   Reviewed with Physician:    Lilyan Gilford MS, LCAS, LPMHCA, NCC, CCSI Therapeutic Triage Specialist 07/20/2018 3:12 PM

## 2018-07-20 NOTE — ED Notes (Signed)
Pt. Verbalizes understanding of discharge education provided. Pt. States he can remain safe upon discharge. Pt. Discharged to lobby.

## 2018-07-20 NOTE — ED Notes (Addendum)
Report on Situation, Background, Assessment, and Recommendations received from Triage, RN. Patient transferred to QUAD. Patient alert and in no visible distress, denies pain. Patient endorses suicidal thoughts, but denies intent or plans. Pt. Endorses depression and anxiety are high lately and is overall description of his mood is dysphoric. Patient made aware of Q15 minute rounds and security for their safety. Patient instructed to come to me with needs or concerns. Pt. Given orientation of the unit and room.

## 2018-07-23 ENCOUNTER — Other Ambulatory Visit: Payer: Self-pay | Admitting: Student

## 2018-07-23 ENCOUNTER — Other Ambulatory Visit (HOSPITAL_COMMUNITY): Payer: Self-pay | Admitting: Student

## 2018-07-23 DIAGNOSIS — N2889 Other specified disorders of kidney and ureter: Secondary | ICD-10-CM

## 2018-08-21 ENCOUNTER — Emergency Department
Admission: EM | Admit: 2018-08-21 | Discharge: 2018-08-21 | Disposition: A | Discharge status: Home / Self Care | Payer: Commercial Managed Care - PPO | Attending: Emergency Medicine | Admitting: Emergency Medicine

## 2018-08-21 ENCOUNTER — Other Ambulatory Visit: Payer: Self-pay

## 2018-08-21 ENCOUNTER — Encounter: Payer: Self-pay | Admitting: Emergency Medicine

## 2018-08-21 DIAGNOSIS — Z79899 Other long term (current) drug therapy: Secondary | ICD-10-CM | POA: Diagnosis not present

## 2018-08-21 DIAGNOSIS — R252 Cramp and spasm: Secondary | ICD-10-CM | POA: Insufficient documentation

## 2018-08-21 DIAGNOSIS — M79651 Pain in right thigh: Secondary | ICD-10-CM | POA: Diagnosis present

## 2018-08-21 MED ORDER — LIDOCAINE 5 % EX PTCH
1.0000 | MEDICATED_PATCH | CUTANEOUS | Status: DC
  Administered 2018-08-21: 09:00:00 1 via TRANSDERMAL
  Filled 2018-08-21: qty 1

## 2018-08-21 MED ORDER — IBUPROFEN 600 MG PO TABS: 600.0000 mg | ORAL_TABLET | Freq: Four times a day (QID) | ORAL | 0 refills | Status: AC | PRN

## 2018-08-21 MED ORDER — KETOROLAC TROMETHAMINE 30 MG/ML IJ SOLN
30.0000 mg | Freq: Once | INTRAMUSCULAR | Status: AC
  Administered 2018-08-21: 30 mg via INTRAMUSCULAR
  Filled 2018-08-21: qty 1

## 2018-08-21 MED ORDER — CYCLOBENZAPRINE HCL 5 MG PO TABS: ORAL_TABLET | ORAL | 0 refills | Status: AC

## 2018-08-21 MED ORDER — LIDOCAINE 5 % EX PTCH: 1.0000 | MEDICATED_PATCH | CUTANEOUS | 0 refills | Status: AC

## 2018-08-21 NOTE — ED Notes (Signed)

## 2018-08-21 NOTE — ED Provider Notes (Signed)
[facility] Emergency Department Provider Note  ____________________________________________  Time seen: Approximately 8:52 AM  I have reviewed the triage vital signs and the nursing notes.   HISTORY  Chief Complaint Leg Pain    HPI Delmos Zahorik is a 33 y.o. male presents emergency department for evaluation of right posterior thigh pain for 1 day.  He states that it feels like a charley horse but the pain has not improved.  Pain is alleviated with pressure and when he raises his knee up to his chest.  He has taken Tylenol for pain.  No known injury.  He is not drinking any water but did drink 1 Gatorade yesterday.  He has not fallen.  No recent travel. No recent surgeries or immobilization. Patient quit smoking 2 days ago.  No history of DVT.  No lower leg swelling, calf pain, numbness, tingling.   Past Medical History:  Diagnosis Date  . Anxiety   . Depression     Patient Active Problem List   Diagnosis Date Noted  . Generalized anxiety disorder 06/18/2018  . 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 No pertinent surgical history.  Prior to Admission medications   Medication Sig Start Date End Date Taking? Authorizing Provider  cyclobenzaprine (FLEXERIL) 5 MG tablet Take 1-2 tablets 3 times daily as needed 08/21/18   Enid Derry, PA-C  docusate sodium (COLACE) 100 MG capsule Take 1 tablet once or twice daily as needed for constipation while taking narcotic pain medicine 07/15/18   Loleta Rose, MD  ibuprofen (ADVIL) 600 MG tablet Take 1 tablet (600 mg total) by mouth every 6 (six) hours as needed. 08/21/18   Enid Derry, PA-C  lidocaine (LIDODERM) 5 % Place 1 patch onto the skin daily. Remove & Discard patch within 12 hours or as directed by MD 08/21/18   Enid Derry, PA-C  ondansetron (ZOFRAN ODT) 4 MG disintegrating tablet Allow 1-2 tablets to dissolve in your mouth every 8 hours as needed for  nausea/vomiting 07/15/18   Loleta Rose, MD  tamsulosin [facility]) 0.4 MG CAPS capsule Take 1 tablet by mouth daily until you pass the kidney stone or no longer have symptoms 07/15/18   Loleta Rose, MD  venlafaxine XR (EFFEXOR-XR) 150 MG 24 hr capsule Take 1 capsule (150 mg total) by mouth daily with breakfast. 07/21/18   Mariel Craft, MD    Allergies Patient has no known allergies.  No family history on file.  Social History Social History   Tobacco Use  . Smoking status: Never Smoker  . Smokeless tobacco: Never Used  Substance Use Topics  . Alcohol use: Not Currently  . Drug use: Not Currently     Review of Systems  Constitutional: No fever/chills ENT: No upper respiratory complaints. Cardiovascular: No chest pain. Respiratory: No cough. No SOB. Gastrointestinal: No abdominal pain.  No nausea, no vomiting.  Musculoskeletal: Positive for thigh pain.  Skin: Negative for rash, abrasions, lacerations, ecchymosis. Neurological: Negative for numbness or tingling   ____________________________________________   PHYSICAL EXAM:  VITAL SIGNS: ED Triage Vitals  Enc Vitals Group     BP 08/21/18 0844 121/74     Pulse Rate 08/21/18 0844 72     Resp 08/21/18 0844 16     Temp 08/21/18 0844 98.4 F (36.9 C)     Temp Source 08/21/18 0844 Oral     SpO2 08/21/18 0844 96 %     Weight 08/21/18 0836 153 lb (69.4 kg)  Height 08/21/18 0836 5\' 9"  (1.753 m)     Head Circumference --      Peak Flow --      Pain Score 08/21/18 0836 8     Pain Loc --      Pain Edu? --      Excl. in GC? --      Constitutional: Alert and oriented. Well appearing and in no acute distress. Eyes: Conjunctivae are normal. PERRL. EOMI. Head: Atraumatic. ENT:      Ears:      Nose: No congestion/rhinnorhea.      Mouth/Throat: Mucous membranes are moist.  Neck: No stridor.   Cardiovascular: Normal rate, regular rhythm.  Good peripheral circulation. Respiratory: Normal respiratory effort without  tachypnea or retractions. Lungs CTAB. Good air entry to the bases with no decreased or absent breath sounds. Gastrointestinal: Bowel sounds 4 quadrants. Soft and nontender to palpation. No guarding or rigidity. No palpable masses. No distention.  Musculoskeletal: Full range of motion to all extremities. No gross deformities appreciated.  Mild tenderness to palpation to mid hamstring.  No skin changes.  No tenderness to palpation in popliteal space.  No calf tenderness.  Negative Homans sign.  No leg swelling. Neurologic:  Normal speech and language. No gross focal neurologic deficits are appreciated.  Skin:  Skin is warm, dry and intact. No rash noted. Psychiatric: Mood and affect are normal. Speech and behavior are normal. Patient exhibits appropriate insight and judgement.   ____________________________________________   LABS (all labs ordered are listed, but only abnormal results are displayed)  Labs Reviewed - No data to display ____________________________________________  EKG   ____________________________________________  RADIOLOGY  No results found.  ____________________________________________    PROCEDURES  Procedure(s) performed:    Procedures    Medications  ketorolac (TORADOL) 30 MG/ML injection 30 mg (30 mg Intramuscular Given 08/21/18 0913)     ____________________________________________   INITIAL IMPRESSION / ASSESSMENT AND PLAN / ED COURSE  Pertinent labs & imaging results that were available during my care of the patient were reviewed by me and considered in my medical decision making (see chart for details).  Review of the [place] CSRS was performed in accordance of the [facility] prior to dispensing any controlled drugs.   Patient's diagnosis is consistent with muscle spasm.  Wells criteria less than 2.  No indication for x-rays at this time.  Patient will be discharged home with prescriptions for Motrin, Lidoderm, and Flexeril.  Patient is to follow up  with primary care as directed. Patient is given ED precautions to return to the ED for any worsening or new symptoms.     ____________________________________________  FINAL CLINICAL IMPRESSION(S) / ED DIAGNOSES  Final diagnoses:  Muscle cramp      NEW MEDICATIONS STARTED DURING THIS VISIT:  ED Discharge Orders         Ordered    cyclobenzaprine (FLEXERIL) 5 MG tablet     08/21/18 0906    ibuprofen (ADVIL) 600 MG tablet  Every 6 hours PRN     08/21/18 0906    lidocaine (LIDODERM) 5 %  Every 24 hours     08/21/18 0906              This chart was dictated using voice recognition software/Dragon. Despite best efforts to proofread, errors can occur which can change the meaning. Any change was purely unintentional.    Enid DerryWagner, Bruchy Mikel, PA-C 08/21/18 1559    Arnaldo NatalMalinda, Paul F, MD 08/21/18 [phone]

## 2018-08-21 NOTE — Discharge Instructions (Signed)
Please take ibuprofen for inflammation and Flexeril to relax muscles.  Please apply heat on and off today.  Drink plenty of water and Gatorade.  Follow-up with primary care early next week.

## 2018-08-21 NOTE — ED Triage Notes (Signed)
Pt to ED via POV c/o right upper thigh pain x 1 day. Pt states that the pain started yesterday morning before going to work. Denies any known injury. Pt denies swelling or redness in the leg. Pt states that he was able to get some relief by pulling his leg up to his chest. Pt is in NAD at this time.

## 2018-09-16 ENCOUNTER — Ambulatory Visit: Payer: Commercial Managed Care - PPO

## 2018-10-22 ENCOUNTER — Ambulatory Visit: Payer: Commercial Managed Care - PPO

## 2020-04-29 IMAGING — CT CT RENAL STONE PROTOCOL
3 of 4 series · 10 of 46 positions shown, 15 images · non-contrast
Comparison: None.

CLINICAL DATA: Right-sided abdominal/flank pain

EXAM:
CT ABDOMEN AND PELVIS WITHOUT CONTRAST
TECHNIQUE: Multidetector CT imaging of the abdomen and pelvis was performed
following the standard protocol without oral or IV contrast.

[Series 4: lung bases · axial · 0.69mm/px · z∈[-666,-566]mm · 6 of 28 slices shown, 11 images]
[im 4/28  soft-tissue]
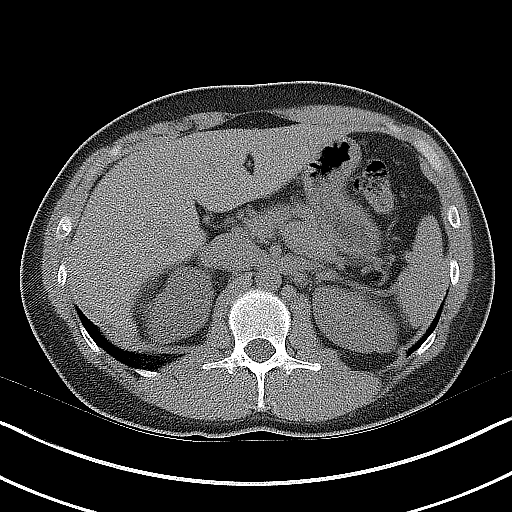
[im 4/28  bone]
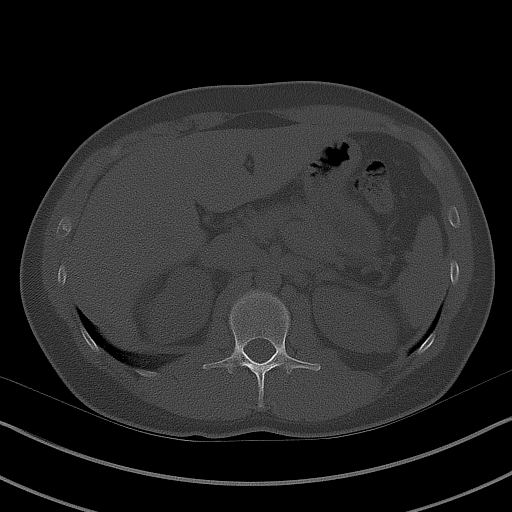
[im 8/28  soft-tissue]
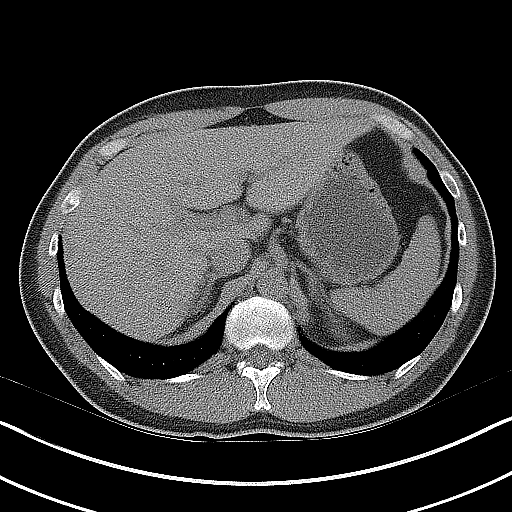
[im 12/28  soft-tissue]
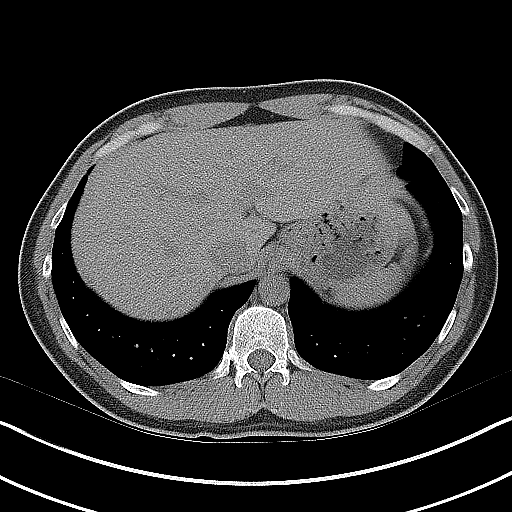
[im 12/28  lung]
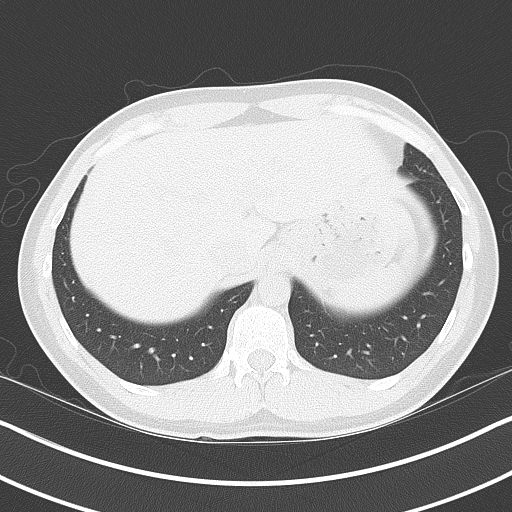
[im 16/28  soft-tissue]
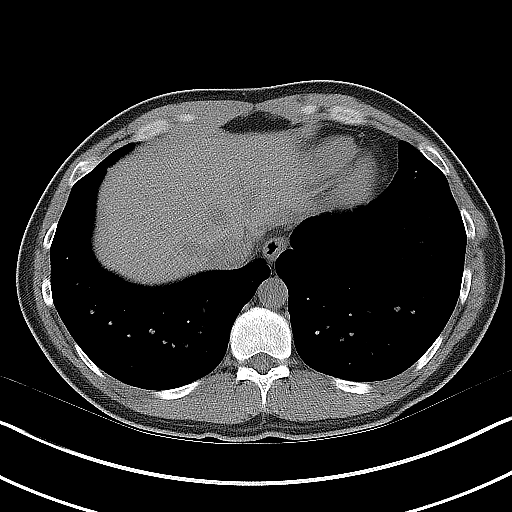
[im 16/28  lung]
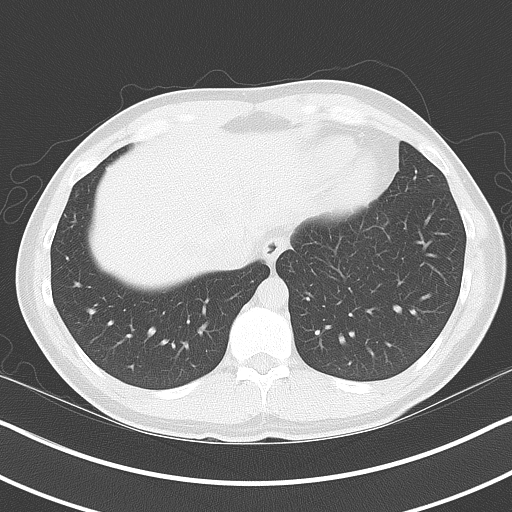
[im 20/28  soft-tissue]
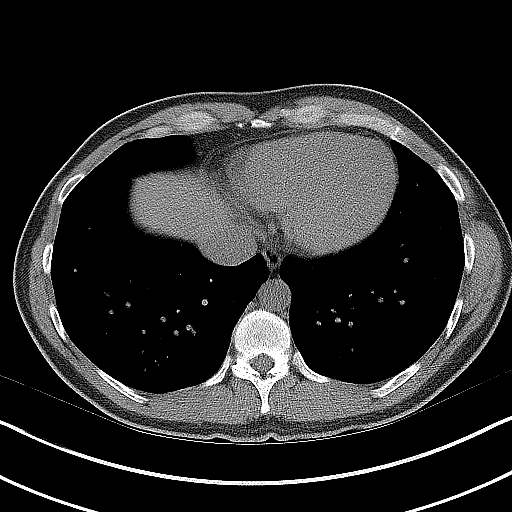
[im 20/28  lung]
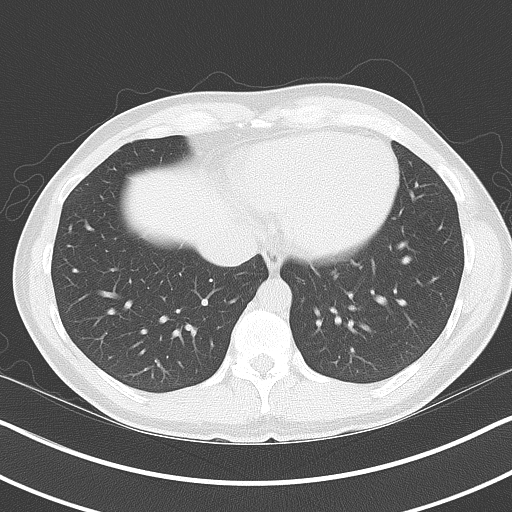
[im 24/28  soft-tissue]
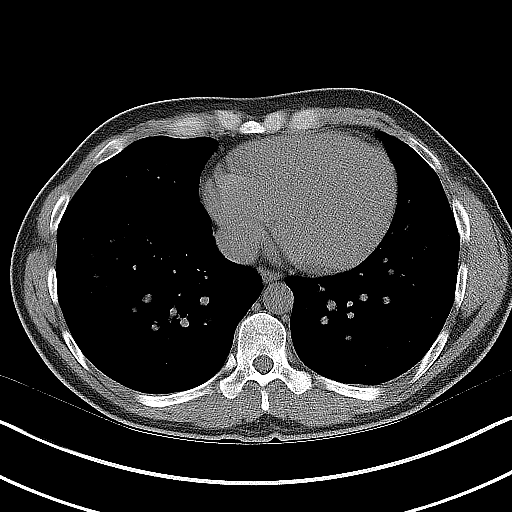
[im 24/28  lung]
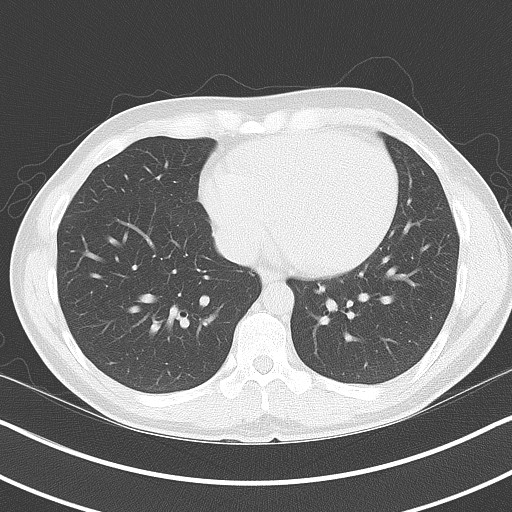

[Series 5: coronal · coronal · 0.74mm/px · 3 of 128 slices shown]
[im 43/128  soft-tissue]
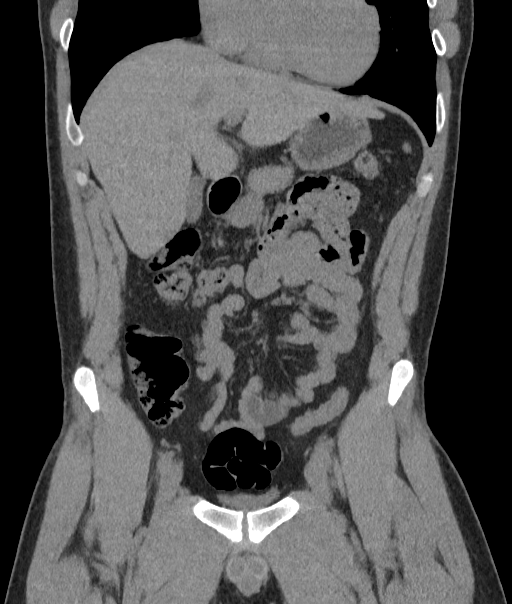
[im 57/128  soft-tissue]
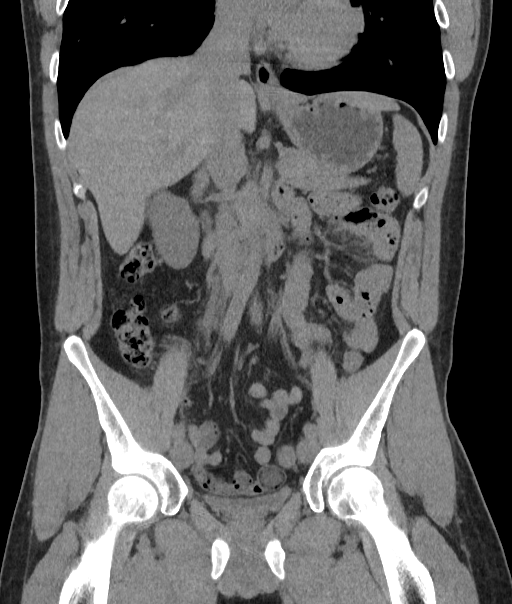
[im 71/128  soft-tissue]
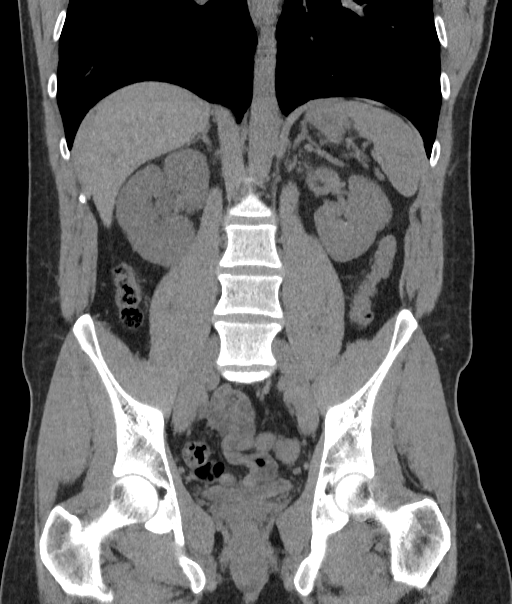

[Series 6: sagittal · sagittal · 0.50mm/px · 1 of 171 slices shown]
[im 57/171  soft-tissue]
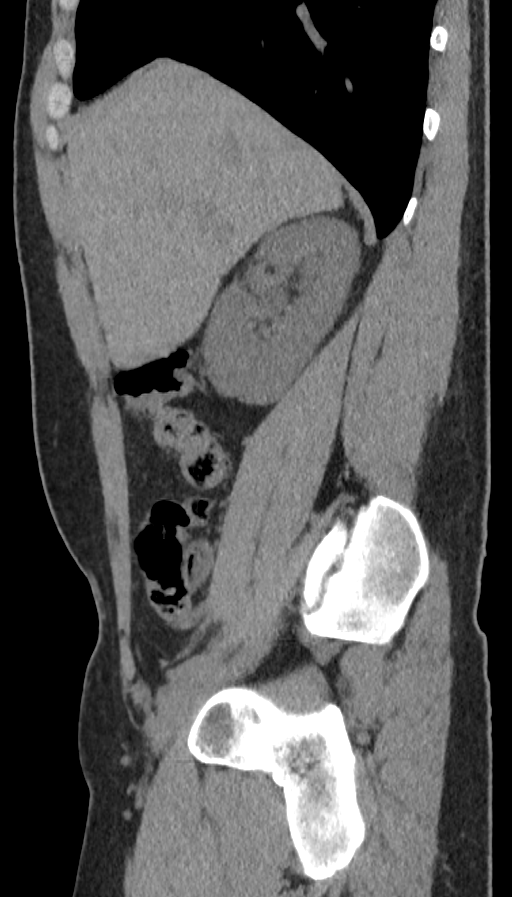

[10 of 46 positions shown; findings below may reference images not displayed]

FINDINGS: Lower chest: Lung bases are clear.

Hepatobiliary: No focal liver lesions are appreciable on this
noncontrast enhanced study. The gallbladder wall is not appreciably
thickened. There is no biliary duct dilatation.

Pancreas: No pancreatic mass or inflammatory focus.

Spleen: No splenic lesions are evident.

Adrenals/Urinary Tract: Adrenals bilaterally appear unremarkable.
There is a focal area of increased attenuation in the lateral left
mid kidney measuring 1.0 x 1.0 cm. Right kidney appears mildly
edematous. There is moderate hydronephrosis on the right. There is
no hydronephrosis on the left. There is no appreciable intrarenal
calculus on either side. There is a 1 mm calculus at the right
ureterovesical junction. No other ureteral calculi are evident.
There is a phlebolith on the left near but separate from the distal
left ureter. Urinary bladder is midline with wall thickness within
normal limits.

Stomach/Bowel: There is no appreciable bowel wall or mesenteric
thickening. There is no evident bowel obstruction. There is no free
air or portal venous air.

Vascular/Lymphatic: No abdominal aortic aneurysm. No vascular
lesions are evident on this noncontrast enhanced study. There is no
appreciable adenopathy in the abdomen or pelvis.

Reproductive: Prostate and seminal vesicles are normal in size and
contour. There is no evident pelvic mass.

Other: The appendix appears normal. There is no abscess or ascites
in the abdomen or pelvis.

Musculoskeletal: No blastic or lytic bone lesions. No intramuscular
or abdominal wall lesion.
IMPRESSION: 1. 1 mm calculus at the right ureterovesical junction causing
moderate hydronephrosis on the right. Right kidney is subtly
edematous.

2. 1 x 1 cm mass in the lateral mid left kidney with attenuation
values higher than is expected with a cyst. This structure may
represent a hyperdense cyst. It does warrant further evaluation,
however. Further evaluation with pre and post contrast MRI
nonemergently should be considered. Pre and post contrast CT could
alternatively be performed, but would likely be of decreased
accuracy given lesion size.

3. No bowel obstruction. No abscess in the abdomen or pelvis.
Appendix appears normal.
